# Patient Record
Sex: Female | Born: 1995
Health system: Southern US, Community
[De-identification: ages and names within clinical notes are randomized; demographics above are authoritative.]

## PROBLEM LIST (undated history)

## (undated) DIAGNOSIS — Z789 Other specified health status: Secondary | ICD-10-CM

## (undated) HISTORY — PX: APPENDECTOMY: SHX54

---
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
## 2018-11-22 ENCOUNTER — Other Ambulatory Visit: Payer: Self-pay | Admitting: Internal Medicine

## 2018-11-22 DIAGNOSIS — R102 Pelvic and perineal pain: Secondary | ICD-10-CM

## 2018-11-23 ENCOUNTER — Other Ambulatory Visit: Payer: Managed Care, Other (non HMO)

## 2019-10-19 ENCOUNTER — Ambulatory Visit: Payer: Managed Care, Other (non HMO)

## 2019-10-23 ENCOUNTER — Ambulatory Visit: Payer: Managed Care, Other (non HMO) | Attending: Family

## 2019-10-23 DIAGNOSIS — Z23 Encounter for immunization: Secondary | ICD-10-CM

## 2019-10-23 NOTE — Progress Notes (Signed)
   Covid-19 Vaccination Clinic  Name:  Anne Moses    MRN: [id number] DOB: 01/23/1996  10/23/2019  Anne Moses was observed post Covid-19 immunization for 15 minutes without incidence. She was provided with Vaccine Information Sheet and instruction to access the V-Safe system.   Anne Moses was instructed to call 911 with any severe reactions post vaccine: Marland Kitchen Difficulty breathing  . Swelling of your face and throat  . A fast heartbeat  . A bad rash all over your body  . Dizziness and weakness    Immunizations Administered    Name Date Dose VIS Date Route   Moderna COVID-19 Vaccine 10/23/2019  4:47 PM 0.5 mL 08/01/2019 Intramuscular   Manufacturer: Moderna   Lot: [id number]   NDC: 73085-694-37

## 2019-12-05 ENCOUNTER — Ambulatory Visit: Payer: Managed Care, Other (non HMO)

## 2019-12-05 ENCOUNTER — Ambulatory Visit: Payer: Managed Care, Other (non HMO) | Attending: Family

## 2019-12-05 DIAGNOSIS — Z23 Encounter for immunization: Secondary | ICD-10-CM

## 2019-12-05 NOTE — Progress Notes (Signed)
   Covid-19 Vaccination Clinic  Name:  Anne Moses    MRN: [id number] DOB: 17-Dec-1995  12/05/2019  [name] was observed post Covid-19 immunization for 15 minutes without incident. She was provided with Vaccine Information Sheet and instruction to access the V-Safe system.   [name] was instructed to call 911 with any severe reactions post vaccine: Marland Kitchen Difficulty breathing  . Swelling of face and throat  . A fast heartbeat  . A bad rash all over body  . Dizziness and weakness   Immunizations Administered    Name Date Dose VIS Date Route   Moderna COVID-19 Vaccine 12/05/2019 11:52 AM 0.5 mL 08/01/2019 Intramuscular   Manufacturer: Moderna   Lot: [id number]   NDC: 19802-217-98

## 2020-04-26 ENCOUNTER — Other Ambulatory Visit: Payer: Self-pay

## 2020-04-26 ENCOUNTER — Other Ambulatory Visit: Payer: Managed Care, Other (non HMO)

## 2020-04-26 DIAGNOSIS — Z20822 Contact with and (suspected) exposure to covid-19: Secondary | ICD-10-CM

## 2020-04-27 LAB — NOVEL CORONAVIRUS, NAA: SARS-CoV-2, NAA: NOT DETECTED

## 2020-04-27 LAB — SARS-COV-2, NAA 2 DAY TAT

## 2021-07-08 ENCOUNTER — Other Ambulatory Visit: Payer: Self-pay

## 2021-07-08 ENCOUNTER — Encounter (HOSPITAL_COMMUNITY): Payer: Self-pay | Admitting: Obstetrics and Gynecology

## 2021-07-08 ENCOUNTER — Inpatient Hospital Stay (HOSPITAL_COMMUNITY)
Admission: AD | Admit: 2021-07-08 | Discharge: 2021-07-08 | Disposition: A | Discharge status: Home / Self Care | Payer: Self-pay | Attending: Obstetrics and Gynecology | Admitting: Obstetrics and Gynecology

## 2021-07-08 ENCOUNTER — Inpatient Hospital Stay (HOSPITAL_COMMUNITY): Payer: Self-pay

## 2021-07-08 DIAGNOSIS — Z3491 Encounter for supervision of normal pregnancy, unspecified, first trimester: Secondary | ICD-10-CM

## 2021-07-08 DIAGNOSIS — R1030 Lower abdominal pain, unspecified: Secondary | ICD-10-CM | POA: Insufficient documentation

## 2021-07-08 DIAGNOSIS — R109 Unspecified abdominal pain: Secondary | ICD-10-CM

## 2021-07-08 DIAGNOSIS — Z3A01 Less than 8 weeks gestation of pregnancy: Secondary | ICD-10-CM

## 2021-07-08 DIAGNOSIS — O99891 Other specified diseases and conditions complicating pregnancy: Secondary | ICD-10-CM

## 2021-07-08 DIAGNOSIS — O26891 Other specified pregnancy related conditions, first trimester: Secondary | ICD-10-CM | POA: Insufficient documentation

## 2021-07-08 DIAGNOSIS — O26899 Other specified pregnancy related conditions, unspecified trimester: Secondary | ICD-10-CM

## 2021-07-08 HISTORY — DX: Other specified health status: Z78.9

## 2021-07-08 LAB — URINALYSIS, ROUTINE W REFLEX MICROSCOPIC
Bilirubin Urine: NEGATIVE
Glucose, UA: NEGATIVE mg/dL
Hgb urine dipstick: NEGATIVE
Ketones, ur: NEGATIVE mg/dL
Leukocytes,Ua: NEGATIVE
Nitrite: NEGATIVE
Protein, ur: NEGATIVE mg/dL
Specific Gravity, Urine: 1.009 (ref 1.005–1.030)
pH: 7 (ref 5.0–8.0)

## 2021-07-08 LAB — CBC
HCT: 39 % (ref 36.0–46.0)
Hemoglobin: 13.4 g/dL (ref 12.0–15.0)
MCH: 28.8 pg (ref 26.0–34.0)
MCHC: 34.4 g/dL (ref 30.0–36.0)
MCV: 83.7 fL (ref 80.0–100.0)
Platelets: 188 10*3/uL (ref 150–400)
RBC: 4.66 MIL/uL (ref 3.87–5.11)
RDW: 13.2 % (ref 11.5–15.5)
WBC: 5.7 10*3/uL (ref 4.0–10.5)
nRBC: 0 % (ref 0.0–0.2)

## 2021-07-08 LAB — HCG, QUANTITATIVE, PREGNANCY: hCG, Beta Chain, Quant, S: 56652 m[IU]/mL — ABNORMAL HIGH (ref <5)

## 2021-07-08 LAB — WET PREP, GENITAL
Clue Cells Wet Prep HPF POC: NONE SEEN
Sperm: NONE SEEN
Trich, Wet Prep: NONE SEEN
WBC, Wet Prep HPF POC: NONE SEEN
Yeast Wet Prep HPF POC: NONE SEEN

## 2021-07-08 LAB — ABO/RH: ABO/RH(D): O POS

## 2021-07-08 LAB — POCT PREGNANCY, URINE: Preg Test, Ur: POSITIVE — AB

## 2021-07-08 NOTE — MAU Note (Signed)
Urine is in the lab 

## 2021-07-08 NOTE — MAU Note (Signed)
Pt reports positive preg test at the [facility] 11/02 and on 11/04 she started having lower abd cramping. Cramping every day and pain is bad enough that she can't do anything. Denies bleeding.

## 2021-07-08 NOTE — Discharge Instructions (Signed)
Safe Medications in Pregnancy    Acne: Benzoyl Peroxide Salicylic Acid  Backache/Headache: Tylenol: 2 regular strength every 4 hours OR              2 Extra strength every 6 hours  Colds/Coughs/Allergies: Benadryl (alcohol free) 25 mg every 6 hours as needed Breath right strips Claritin Cepacol throat lozenges Chloraseptic throat spray Cold-Eeze- up to three times per day Cough drops, alcohol free Flonase (by prescription only) Guaifenesin Mucinex Robitussin DM (plain only, alcohol free) Saline nasal spray/drops Sudafed (pseudoephedrine) & Actifed ** use only after [redacted] weeks gestation and if you do not have high blood pressure Tylenol Vicks Vaporub Zinc lozenges Zyrtec   Constipation: Colace Ducolax suppositories Fleet enema Glycerin suppositories Metamucil Milk of magnesia Miralax Senokot Smooth move tea  Diarrhea: Kaopectate Imodium A-D  *NO pepto Bismol  Hemorrhoids: Anusol Anusol HC Preparation H Tucks  Indigestion: Tums Maalox Mylanta Zantac  Pepcid  Insomnia: Benadryl (alcohol free) 25mg every 6 hours as needed Tylenol PM Unisom, no Gelcaps  Leg Cramps: Tums MagGel  Nausea/Vomiting:  Bonine Dramamine Emetrol Ginger extract Sea bands Meclizine  Nausea medication to take during pregnancy:  Unisom (doxylamine succinate 25 mg tablets) Take one tablet daily at bedtime. If symptoms are not adequately controlled, the dose can be increased to a maximum recommended dose of two tablets daily (1/2 tablet in the morning, 1/2 tablet mid-afternoon and one at bedtime). Vitamin B6 100mg tablets. Take one tablet twice a day (up to 200 mg per day).  Skin Rashes: Aveeno products Benadryl cream or 25mg every 6 hours as needed Calamine Lotion 1% cortisone cream  Yeast infection: Gyne-lotrimin 7 Monistat 7   **If taking multiple medications, please check labels to avoid duplicating the same active ingredients **take  medication as directed on the label ** Do not exceed 4000 mg of tylenol in 24 hours **Do not take medications that contain aspirin or ibuprofen   [facility] Providers   [facility]             [address], [place], [place] [address] [phone]  [facility]                                                             [address], [address], [place], [place], [address] [phone]  [facility]                                    [address], [address], [place], [place], [address] [phone]  [facility] [place] [address], [address], [place], [place], [address] [phone]  [facility]                                 [address], [place], , [address] [phone]  [facility]                                      [address], [place], [place], [address] [phone]  [facility] [address], [address], [place], [place], [address]                              [facility] [address], [place], [place], [address] [phone]  [facility]         Phone: [phone]  [facility]      Phone: [phone]   [facility]      Phone: [phone]  [facility]         Phone: [phone]   [facility]    Phone: [phone]  [facility]      Phone: [phone]  [facility]      Phone: [phone]  [facility]        Phone: [phone]  [facility]      Phone: [phone]  

## 2021-07-08 NOTE — MAU Provider Note (Signed)
History     CSN: [id number]  Arrival date and time: 07/08/21 1106   None     Chief Complaint  Patient presents with   Abdominal Pain   HPI Anne Moses is a 25 y.o. G1P0 at [redacted]w[redacted]d by LMP presents for sharp lower abdominal cramping. Patient reports pain started last Friday, pain is intermittent but occurs every day. She also endorses a clear/white vaginal discharge and increased in urinary frequency. She denies vaginal bleeding, itching, odor, dysuria or frequency.    OB History     Gravida  1   Para      Term      Preterm      AB      Living         SAB      IAB      Ectopic      Multiple      Live Births              Past Medical History:  Diagnosis Date   Medical history non-contributory     Past Surgical History:  Procedure Laterality Date   APPENDECTOMY      History reviewed. No pertinent family history.  Social History   Tobacco Use   Smoking status: Never   Smokeless tobacco: Never  Vaping Use   Vaping Use: Never used  Substance Use Topics   Alcohol use: No   Drug use: No    Allergies: No Known Allergies  Medications Prior to Admission  Medication Sig Dispense Refill Last Dose   magnesium citrate solution Take 296 mLs by mouth once. OTC 300 mL 0     Review of Systems  Constitutional: Negative.   Respiratory: Negative.    Cardiovascular: Negative.   Gastrointestinal:  Positive for abdominal pain. Negative for constipation, nausea and vomiting.  Genitourinary:  Positive for frequency and vaginal discharge. Negative for dysuria, urgency and vaginal bleeding.  Musculoskeletal: Negative.   Neurological: Negative.   Psychiatric/Behavioral: Negative.    Physical Exam   Blood pressure 128/79, pulse 68, temperature 98.3 F (36.8 C), resp. rate 14, last menstrual period 06/02/2021, SpO2 100 %.  Physical Exam Constitutional:      Appearance: She is well-developed.  HENT:     Head: Normocephalic and atraumatic.  Cardiovascular:      Rate and Rhythm: Normal rate.  Pulmonary:     Effort: Pulmonary effort is normal.  Abdominal:     General: Abdomen is flat.     Palpations: Abdomen is soft.     Tenderness: There is no abdominal tenderness.  Genitourinary:    Comments: NEFG, blind swabs obtained Neurological:     Mental Status: She is alert.   US OB LESS THAN 14 WEEKS WITH OB TRANSVAGINAL  Result Date: 07/08/2021 CLINICAL DATA:  Abdominal pain EXAM: OBSTETRIC <14 WK [place] AND TRANSVAGINAL OB US TECHNIQUE: Both transabdominal and transvaginal ultrasound examinations were performed for complete evaluation of the gestation as well as the maternal uterus, adnexal regions, and pelvic cul-de-sac. Transvaginal technique was performed to assess early pregnancy. COMPARISON:  None. FINDINGS: Intrauterine gestational sac: Single Yolk sac:  Visualized. Embryo:  Visualized. Cardiac Activity: Visualized. Heart Rate: 154 bpm CRL: 3.2 mm   5 w   6 d                  [place] EDC: 03/04/2022 Subchorionic hemorrhage:  None visualized. Maternal uterus/adnexae: Ovaries are unremarkable with corpus luteum on the right. There is a 1.2 x 1.3  x 1.3 cm fibroid at the uterine fundus anteriorly. Surrounding flow on color Doppler. Trace free fluid. IMPRESSION: Single live intrauterine pregnancy as detailed above. A small uterine fibroid is present. Electronically Signed   By: Guadlupe Spanish M.D.   On: 07/08/2021 13:39    MAU Course  Procedures  MDM UA unremarkable CBC, HCG, ABO/RH Wet prep negative, GC/CT pending [place] with IUP  Assessment and Plan  IUP Abdominal pain affecting pregnancy [redacted] weeks gestation of pregnancy   - List of safe meds in pregnany provided - Establish [facility] with any provider of your choice - Strict return precautions reviewed.  - Return to MAU as needed   Brand Males, CNM 07/08/2021, 2:06 PM

## 2021-07-09 LAB — GC/CHLAMYDIA PROBE AMP (~~LOC~~) NOT AT ARMC
Chlamydia: NEGATIVE
Comment: NEGATIVE
Comment: NORMAL
Neisseria Gonorrhea: NEGATIVE

## 2022-05-18 ENCOUNTER — Ambulatory Visit: Payer: Self-pay | Admitting: Podiatry

## 2022-09-30 ENCOUNTER — Encounter (HOSPITAL_COMMUNITY): Payer: Self-pay

## 2022-09-30 ENCOUNTER — Ambulatory Visit (HOSPITAL_COMMUNITY)
Admission: EM | Admit: 2022-09-30 | Discharge: 2022-09-30 | Disposition: A | Discharge status: Home / Self Care | Payer: Commercial Managed Care - HMO | Attending: Physician Assistant | Admitting: Physician Assistant

## 2022-09-30 DIAGNOSIS — R051 Acute cough: Secondary | ICD-10-CM | POA: Insufficient documentation

## 2022-09-30 DIAGNOSIS — Z1152 Encounter for screening for COVID-19: Secondary | ICD-10-CM | POA: Diagnosis not present

## 2022-09-30 DIAGNOSIS — Z8616 Personal history of COVID-19: Secondary | ICD-10-CM | POA: Diagnosis not present

## 2022-09-30 DIAGNOSIS — J069 Acute upper respiratory infection, unspecified: Secondary | ICD-10-CM | POA: Insufficient documentation

## 2022-09-30 LAB — SARS CORONAVIRUS 2 (TAT 6-24 HRS): SARS Coronavirus 2: NEGATIVE

## 2022-09-30 LAB — POC URINE PREG, ED: Preg Test, Ur: NEGATIVE

## 2022-09-30 MED ORDER — PROMETHAZINE-DM 6.25-15 MG/5ML PO SYRP: 5.0000 mL | ORAL_SOLUTION | Freq: Three times a day (TID) | ORAL | 0 refills | Status: AC | PRN

## 2022-09-30 MED ORDER — FLUTICASONE PROPIONATE 50 MCG/ACT NA SUSP: 1.0000 | Freq: Every day | NASAL | 0 refills | Status: AC

## 2022-09-30 NOTE — ED Provider Notes (Signed)
[place]    CSN: [id number] Arrival date & time: 09/30/22  3825      History   Chief Complaint Chief Complaint  Patient presents with   Cough    HPI Anne Moses is a 27 y.o. female.   Patient presents today with a 3-day history of URI symptoms including cough, congestion, body aches, subjective fever, chills, shortness of breath.  Denies any nausea, vomiting, diarrhea.  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  Denies any significant past medical history including allergies, asthma, COPD.  She does smoke.  Denies any recent antibiotics or steroids.  She does not believe that she could be pregnant but is not confident about this.  She has been taking over-the-counter medications with minimal improvement of symptoms.  She has had COVID-19 vaccinations including a booster.  Had COVID approximately 2 years ago.    Past Medical History:  Diagnosis Date   Medical history non-contributory     There are no problems to display for this patient.   Past Surgical History:  Procedure Laterality Date   APPENDECTOMY      OB History     Gravida  1   Para      Term      Preterm      AB      Living         SAB      IAB      Ectopic      Multiple      Live Births               Home Medications    Prior to Admission medications   Medication Sig Start Date End Date Taking? Authorizing Provider  fluticasone (FLONASE) 50 MCG/ACT nasal spray Place 1 spray into both nostrils daily. 09/30/22  Yes Kevork Joyce K, PA-C  promethazine-dextromethorphan (PROMETHAZINE-DM) 6.25-15 MG/5ML syrup Take 5 mLs by mouth 3 (three) times daily as needed for cough. 09/30/22  Yes Sheneika Walstad, Derry Skill, PA-C    Family History History reviewed. No pertinent family history.  Social History Social History   Tobacco Use   Smoking status: Never   Smokeless tobacco: Never  Vaping Use   Vaping Use: Never used  Substance  Use Topics   Alcohol use: No   Drug use: No     Allergies   Patient has no known allergies.   Review of Systems Review of Systems  Constitutional:  Positive for activity change, chills, fatigue and fever (subjective). Negative for appetite change.  HENT:  Positive for congestion and sore throat. Negative for sinus pressure and sneezing.   Respiratory:  Positive for cough and shortness of breath.   Cardiovascular:  Negative for chest pain.  Gastrointestinal:  Negative for abdominal pain, diarrhea, nausea and vomiting.     Physical Exam Triage Vital Signs ED Triage Vitals  Enc Vitals Group     BP 09/30/22 1143 109/73     Pulse Rate 09/30/22 1143 83     Resp 09/30/22 1143 18     Temp 09/30/22 1143 98.1 F (36.7 C)     Temp Source 09/30/22 1143 Oral     SpO2 09/30/22 1143 98 %     Weight --      Height --      Head Circumference --      Peak Flow --      Pain Score 09/30/22 1144 8  Pain Loc --      Pain Edu? --      Excl. in [place]? --    No data found.  Updated Vital Signs BP 109/73 (BP Location: Left Arm)   Pulse 83   Temp 98.1 F (36.7 C) (Oral)   Resp 18   LMP 09/12/2022   SpO2 98%   Breastfeeding No   Visual Acuity Right Eye Distance:   Left Eye Distance:   Bilateral Distance:    Right Eye Near:   Left Eye Near:    Bilateral Near:     Physical Exam Vitals reviewed.  Constitutional:      General: She is awake. She is not in acute distress.    Appearance: Normal appearance. She is well-developed. She is not ill-appearing.     Comments: Very pleasant female appears stated age in no acute distress sitting on examining table  HENT:     Head: Normocephalic and atraumatic.     Right Ear: Tympanic membrane, ear canal and external ear normal. Tympanic membrane is not erythematous or bulging.     Left Ear: Tympanic membrane, ear canal and external ear normal. Tympanic membrane is not erythematous or bulging.     Nose:     Right Sinus: No maxillary sinus  tenderness or frontal sinus tenderness.     Left Sinus: No maxillary sinus tenderness or frontal sinus tenderness.     Mouth/Throat:     Pharynx: Uvula midline. Posterior oropharyngeal erythema present. No oropharyngeal exudate.     Comments: Erythema and drainage in posterior oropharynx Cardiovascular:     Rate and Rhythm: Normal rate and regular rhythm.     Heart sounds: Normal heart sounds, S1 normal and S2 normal. No murmur heard. Pulmonary:     Effort: Pulmonary effort is normal.     Breath sounds: Normal breath sounds. No wheezing, rhonchi or rales.     Comments: Clear to auscultation bilaterally Psychiatric:        Behavior: Behavior is cooperative.      UC Treatments / Results  Labs (all labs ordered are listed, but only abnormal results are displayed) Labs Reviewed  SARS CORONAVIRUS 2 (TAT 6-24 HRS)  POC URINE PREG, ED    EKG   Radiology No results found.  Procedures Procedures (including critical care time)  Medications Ordered in UC Medications - No data to display  Initial Impression / Assessment and Plan / UC Course  I have reviewed the triage vital signs and the nursing notes.  Pertinent labs & imaging results that were available during my care of the patient were reviewed by me and considered in my medical decision making (see chart for details).     Patient is well-appearing, afebrile, nontoxic, nontachycardic.  Urine pregnancy test was negative in clinic today.  Patient is outside the window of effectiveness for Tamiflu so influenza testing was deferred.  COVID testing was obtained and is pending.  She is young and otherwise healthy so not a candidate for antiviral therapy.  Will treat symptomatically with Promethazine DM.  Discussed that this can be sedating and she is not to drive or drink alcohol with taking it.  Recommended over-the-counter medications for additional symptom relief including Mucinex, Flonase.  She is to rest and drink plenty of fluid.   Discussed that if her symptoms or not improving within a week she should return for reevaluation.  If she has any worsening or changing symptoms she should be seen immediately.  Strict return precautions given.  Work excuse note with current CDC return to work guidelines provided during visit today.  Final Clinical Impressions(s) / UC Diagnoses   Final diagnoses:  Upper respiratory tract infection, unspecified type  Acute cough     Discharge Instructions      I am concerned that you have a virus.  We tested you for COVID.  We will contact you if you are positive for COVID.  Please monitor your MyChart for these results.  Take Promethazine DM for cough.  This will make you sleepy so do not drive or drink alcohol while taking it.  Alternate Tylenol and ibuprofen for fever and pain.  Use Flonase to help with your congestion.  You can use over-the-counter medication such as Mucinex.  Make sure that you rest and drink plenty of fluid.  If your symptoms are improving within a week please return for reevaluation.  If anything worsens and you have worsening cough, shortness of breath, fever not responding to medication, nausea/vomiting interfere with oral intake, weakness you need to be seen immediately.     ED Prescriptions     Medication Sig Dispense Auth. Provider   promethazine-dextromethorphan (PROMETHAZINE-DM) 6.25-15 MG/5ML syrup Take 5 mLs by mouth 3 (three) times daily as needed for cough. 118 mL Indie Boehne K, PA-C   fluticasone (FLONASE) 50 MCG/ACT nasal spray Place 1 spray into both nostrils daily. 16 g Alonna Bartling K, PA-C      PDMP not reviewed this encounter.   Terrilee Croak, PA-C 09/30/22 1217

## 2022-09-30 NOTE — ED Triage Notes (Signed)
Pt c/o cough, nasal/chest congestion, headaches/body aches, and chills x3 days. Taking OTC meds with no relief.

## 2022-09-30 NOTE — Discharge Instructions (Signed)
I am concerned that you have a virus.  We tested you for COVID.  We will contact you if you are positive for COVID.  Please monitor your MyChart for these results.  Take Promethazine DM for cough.  This will make you sleepy so do not drive or drink alcohol while taking it.  Alternate Tylenol and ibuprofen for fever and pain.  Use Flonase to help with your congestion.  You can use over-the-counter medication such as Mucinex.  Make sure that you rest and drink plenty of fluid.  If your symptoms are improving within a week please return for reevaluation.  If anything worsens and you have worsening cough, shortness of breath, fever not responding to medication, nausea/vomiting interfere with oral intake, weakness you need to be seen immediately.

## 2024-06-16 ENCOUNTER — Ambulatory Visit: Payer: Self-pay
